# Patient Record
Sex: Female | Born: 1995
Health system: Southern US, Community
[De-identification: ages and names within clinical notes are randomized; demographics above are authoritative.]

## PROBLEM LIST (undated history)

## (undated) DIAGNOSIS — Z23 Encounter for immunization: Secondary | ICD-10-CM

## (undated) HISTORY — PX: TONSILLECTOMY: SHX5217

## (undated) HISTORY — DX: Encounter for immunization: Z23

---
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
## 2017-05-14 DIAGNOSIS — Z3202 Encounter for pregnancy test, result negative: Secondary | ICD-10-CM | POA: Diagnosis not present

## 2017-05-14 DIAGNOSIS — Z3200 Encounter for pregnancy test, result unknown: Secondary | ICD-10-CM | POA: Diagnosis not present

## 2017-08-27 DIAGNOSIS — Z1389 Encounter for screening for other disorder: Secondary | ICD-10-CM | POA: Diagnosis not present

## 2017-08-27 DIAGNOSIS — Z30011 Encounter for initial prescription of contraceptive pills: Secondary | ICD-10-CM | POA: Diagnosis not present

## 2017-08-27 DIAGNOSIS — Z0000 Encounter for general adult medical examination without abnormal findings: Secondary | ICD-10-CM | POA: Diagnosis not present

## 2017-08-27 DIAGNOSIS — Z3009 Encounter for other general counseling and advice on contraception: Secondary | ICD-10-CM | POA: Diagnosis not present

## 2017-08-27 DIAGNOSIS — Z3202 Encounter for pregnancy test, result negative: Secondary | ICD-10-CM | POA: Diagnosis not present

## 2017-12-14 ENCOUNTER — Encounter: Payer: Self-pay | Admitting: Family Medicine

## 2017-12-14 ENCOUNTER — Ambulatory Visit (INDEPENDENT_AMBULATORY_CARE_PROVIDER_SITE_OTHER): Payer: Self-pay | Admitting: Family Medicine

## 2017-12-14 VITALS — BP 120/80 | HR 82 | Temp 98.8°F | Wt 131.0 lb

## 2017-12-14 DIAGNOSIS — L089 Local infection of the skin and subcutaneous tissue, unspecified: Secondary | ICD-10-CM

## 2017-12-14 MED ORDER — CEPHALEXIN 500 MG PO CAPS: 500.0000 mg | ORAL_CAPSULE | Freq: Three times a day (TID) | ORAL | 0 refills | Status: AC

## 2017-12-14 MED ORDER — TRIAMCINOLONE ACETONIDE 0.1 % EX CREA: 1.0000 "application " | TOPICAL_CREAM | Freq: Two times a day (BID) | CUTANEOUS | 0 refills | Status: DC

## 2017-12-14 NOTE — Patient Instructions (Signed)

## 2017-12-14 NOTE — Progress Notes (Signed)
Patient ID: Heather Mack, female    DOB: 04-Dec-1995, 22 y.o.   MRN: [id number]  PCP: No primary care provider on file.  Chief Complaint  Patient presents with  . choice-? insect bite    Subjective:  HPI Heather Mack is a 22 y.o. female presents for evaluation of insect bite on her left lower extremity.  Onset of what patient suspected as a spider bite occurred approximately one month ago. She reports initially the lesion began to improve, however of recent she has developed small papules with white heads which itch and nodular type lesions which are tender to touch. Lesions are localized and doesn't appear on any other part of her body. She has not attempted relief with any topical medications. Denies fever, chills, drainage from the sight, or paraesthesias.  Social History   Socioeconomic History  . Marital status: Single    Spouse name: Not on file  . Number of children: Not on file  . Years of education: Not on file  . Highest education level: Not on file  Occupational History  . Not on file  Social Needs  . Financial resource strain: Not on file  . Food insecurity:    Worry: Not on file    Inability: Not on file  . Transportation needs:    Medical: Not on file    Non-medical: Not on file  Tobacco Use  . Smoking status: Never Smoker  . Smokeless tobacco: Never Used  Substance and Sexual Activity  . Alcohol use: Not on file  . Drug use: Not on file  . Sexual activity: Not on file  Lifestyle  . Physical activity:    Days per week: Not on file    Minutes per session: Not on file  . Stress: Not on file  Relationships  . Social connections:    Talks on phone: Not on file    Gets together: Not on file    Attends religious service: Not on file    Active member of club or organization: Not on file    Attends meetings of clubs or organizations: Not on file    Relationship status: Not on file  . Intimate partner violence:    Fear of current or ex partner: Not on file    Emotionally abused: Not on file    Physically abused: Not on file    Forced sexual activity: Not on file  Other Topics Concern  . Not on file  Social History Narrative  . Not on file   No family history on file.   Review of Systems  Pertinent negatives listed in HPI   There are no active problems to display for this patient.  Not on File  Prior to Admission medications   Medication Sig Start Date End Date Taking? Authorizing Provider  Janann Colonel 0.1-20 MG-MCG tablet  12/14/17  Yes [provider]    Past Medical, Surgical Family and Social History reviewed and updated.    Objective:   Today's Vitals   12/14/17 0858  BP: 120/80  Pulse: 82  Temp: 98.8 F (37.1 C)  SpO2: 98%  Weight: 131 lb (59.4 kg)    Wt Readings from Last 3 Encounters:  12/14/17 131 lb (59.4 kg)   Physical Exam  Constitutional: She appears well-developed and well-nourished.  Cardiovascular: Normal rate.  Pulmonary/Chest: Effort normal.  Skin: Skin is warm and intact. Rash noted. Rash is maculopapular and nodular.       Assessment & Plan:  1. Skin infection, unknown  etiology. Will treat with Keflex 500 mg, TID for skin infection. For itching, will trial a short course of topical steriodal ointment  BID until itching resolves. If no improvement after completion of antibiotics, patient advised to follow-up here or with PCP.   If symptoms worsen or do not improve, return for follow-up, follow-up with PCP, or at the emergency department if severity of symptoms warrant a higher level of care.   Godfrey PickKimberly S. Tiburcio PeaHarris, MSN, FNP-C [facility]  [address]  [place], [place] [address] [phone]

## 2020-05-06 NOTE — Patient Instructions (Signed)
I value your feedback and entrusting us with your care. If you get a [facility] patient survey, I would appreciate you taking the time to let us know about your experience today. Thank you!  As of June 08, 2019, your lab results will be released to your MyChart immediately, before I even have a chance to see them. Please give me time to review them and contact you if there are any abnormalities. Thank you for your patience.  

## 2020-05-06 NOTE — Progress Notes (Signed)
Patient, No Pcp Per   Chief Complaint  Patient presents with  . Vaginal Discharge    sour odor, itchiness, irritation   . Dysmenorrhea    x 4-5 months    HPI:      [name] is a 24 y.o. No obstetric history on file. whose LMP was Patient's last menstrual period was 04/11/2020 (exact date)., presents today for NP eval of increased vag d/c since adolescence. Sometimes has ext ithcing, usually triggerd by scratching; no fishy odor. Uses scented body wash, no dryer sheets, rare thong use, not using wipes. She is sex active, using condoms. Neg STD testing 1-2 yrs ago. Never had pap.   Menses monthly, lasting 4-5 days, no BTB, mild dysmen usually but has had increased dysmen past 4 months, sometimes missing work. Uses NSAIDs with some relief. Also with loose stools with menses. No non-menstrual pelvic pain. Sister with ovar cyst, mom with cx cancer. Did OCPs, nexplanon, and nuvaring in past. Doesn't like how she feels on [facility], prefers not to be on it.   Gardasil completed.   Past Medical History:  Diagnosis Date  . Vaccine for human papilloma virus (HPV) types 6, 11, 16, and 18 administered     Past Surgical History:  Procedure Laterality Date  . TONSILLECTOMY      Family History  Problem Relation Age of Onset  . Cervical cancer Mother 27    Social History   Socioeconomic History  . Marital status: Single    Spouse name: Not on file  . Number of children: Not on file  . Years of education: Not on file  . Highest education level: Not on file  Occupational History  . Not on file  Tobacco Use  . Smoking status: Never Smoker  . Smokeless tobacco: Never Used  Vaping Use  . Vaping Use: Never used  Substance and Sexual Activity  . Alcohol use: Yes  . Drug use: Never  . Sexual activity: Yes    Birth control/protection: None, Condom  Other Topics Concern  . Not on file  Social History Narrative  . Not on file   Social Determinants of Health   Financial  Resource Strain:   . Difficulty of Paying Living Expenses: Not on file  Food Insecurity:   . Worried About Programme researcher, broadcasting/film/video in the Last Year: Not on file  . Ran Out of Food in the Last Year: Not on file  Transportation Needs:   . Lack of Transportation (Medical): Not on file  . Lack of Transportation (Non-Medical): Not on file  Physical Activity:   . Days of Exercise per Week: Not on file  . Minutes of Exercise per Session: Not on file  Stress:   . Feeling of Stress : Not on file  Social Connections:   . Frequency of Communication with Friends and Family: Not on file  . Frequency of Social Gatherings with Friends and Family: Not on file  . Attends Religious Services: Not on file  . Active Member of Clubs or Organizations: Not on file  . Attends Banker Meetings: Not on file  . Marital Status: Not on file  Intimate Partner Violence:   . Fear of Current or Ex-Partner: Not on file  . Emotionally Abused: Not on file  . Physically Abused: Not on file  . Sexually Abused: Not on file    Outpatient Medications Prior to Visit  Medication Sig Dispense Refill  . AVIANE 0.1-20 MG-MCG tablet     .  triamcinolone cream (KENALOG) 0.1 % Apply 1 application topically 2 (two) times daily. 30 g 0   No facility-administered medications prior to visit.      ROS:  Review of Systems  Constitutional: Negative for fever.  Gastrointestinal: Negative for blood in stool, constipation, diarrhea, nausea and vomiting.  Genitourinary: Positive for vaginal discharge. Negative for dyspareunia, dysuria, flank pain, frequency, hematuria, urgency, vaginal bleeding and vaginal pain.  Musculoskeletal: Negative for back pain.  Skin: Negative for rash.  BREAST: No symptoms   OBJECTIVE:   Vitals:  BP 124/60   Ht 5\' 4"  (1.626 m)   Wt 153 lb (69.4 kg)   LMP 04/11/2020 (Exact Date)   BMI 26.26 kg/m   Physical Exam Vitals reviewed.  Constitutional:      Appearance: She is  well-developed.  Pulmonary:     Effort: Pulmonary effort is normal.  Genitourinary:    General: Normal vulva.     Pubic Area: No rash.      Labia:        Right: No rash, tenderness or lesion.        Left: No rash, tenderness or lesion.      Vagina: Normal. No vaginal discharge, erythema or tenderness.     Cervix: Normal.     Uterus: Normal. Not enlarged and not tender.      Adnexa: Right adnexa normal and left adnexa normal.       Right: No mass or tenderness.         Left: No mass or tenderness.    Musculoskeletal:        General: Normal range of motion.     Cervical back: Normal range of motion.  Skin:    General: Skin is warm and dry.  Neurological:     General: No focal deficit present.     Mental Status: She is alert and oriented to person, place, and time.  Psychiatric:        Mood and Affect: Mood normal.        Behavior: Behavior normal.        Thought Content: Thought content normal.        Judgment: Judgment normal.     Results: Results for orders placed or performed in visit on 05/07/20 (from the past 24 hour(s))  POCT Wet Prep with KOH     Status: Normal   Collection Time: 05/07/20  2:40 PM  Result Value Ref Range   Trichomonas, UA Negative    Clue Cells Wet Prep HPF POC neg    Epithelial Wet Prep HPF POC     Yeast Wet Prep HPF POC neg    Bacteria Wet Prep HPF POC     RBC Wet Prep HPF POC     WBC Wet Prep HPF POC     KOH Prep POC Negative Negative     Assessment/Plan: Vaginal discharge - Plan: NuSwab Vaginitis (VG), POCT Wet Prep with KOH; neg wet prep/exam. Check pap, STDs and culture. If neg, normal physiologic d/c. Discussed normal colors/consistencies. Use dove sens skin soap for itch; cold compresses/OTC hydrocortisone crm. F/u prn.   Dysmenorrhea--neg exam, no sx if not having period. Call with next menses for GYN u/s. Will f/u with results.   Cervical cancer screening - Plan: Cytology - PAP  Screening for STD (sexually transmitted disease) -  Plan: Cytology - PAP     Return if symptoms worsen or fail to improve.  Heather Mack B. Heather Ingraham, PA-C 05/07/2020 2:42 PM

## 2020-05-07 ENCOUNTER — Ambulatory Visit (INDEPENDENT_AMBULATORY_CARE_PROVIDER_SITE_OTHER): Payer: 59 | Admitting: Obstetrics and Gynecology

## 2020-05-07 ENCOUNTER — Other Ambulatory Visit: Payer: Self-pay

## 2020-05-07 ENCOUNTER — Encounter: Payer: Self-pay | Admitting: Obstetrics and Gynecology

## 2020-05-07 ENCOUNTER — Other Ambulatory Visit (HOSPITAL_COMMUNITY)
Admission: RE | Admit: 2020-05-07 | Discharge: 2020-05-07 | Disposition: A | Discharge status: Home / Self Care | Payer: Self-pay | Source: Ambulatory Visit | Attending: Obstetrics and Gynecology | Admitting: Obstetrics and Gynecology

## 2020-05-07 VITALS — BP 124/60 | Ht 64.0 in | Wt 153.0 lb

## 2020-05-07 DIAGNOSIS — Z113 Encounter for screening for infections with a predominantly sexual mode of transmission: Secondary | ICD-10-CM | POA: Diagnosis not present

## 2020-05-07 DIAGNOSIS — N946 Dysmenorrhea, unspecified: Secondary | ICD-10-CM

## 2020-05-07 DIAGNOSIS — N898 Other specified noninflammatory disorders of vagina: Secondary | ICD-10-CM | POA: Diagnosis not present

## 2020-05-07 DIAGNOSIS — Z124 Encounter for screening for malignant neoplasm of cervix: Secondary | ICD-10-CM | POA: Insufficient documentation

## 2020-05-07 LAB — POCT WET PREP WITH KOH
Clue Cells Wet Prep HPF POC: NEGATIVE
KOH Prep POC: NEGATIVE
Trichomonas, UA: NEGATIVE
Yeast Wet Prep HPF POC: NEGATIVE

## 2020-05-09 ENCOUNTER — Encounter: Payer: Self-pay | Admitting: Obstetrics and Gynecology

## 2020-05-09 DIAGNOSIS — N946 Dysmenorrhea, unspecified: Secondary | ICD-10-CM

## 2020-05-10 LAB — NUSWAB VAGINITIS (VG)
Candida albicans, NAA: POSITIVE — AB
Candida glabrata, NAA: NEGATIVE
Trich vag by NAA: NEGATIVE

## 2020-05-10 LAB — CYTOLOGY - PAP
Chlamydia: NEGATIVE
Comment: NEGATIVE
Comment: NORMAL
Diagnosis: NEGATIVE
Neisseria Gonorrhea: NEGATIVE

## 2020-05-11 MED ORDER — FLUCONAZOLE 150 MG PO TABS: 150.0000 mg | ORAL_TABLET | Freq: Once | ORAL | 0 refills | Status: AC

## 2020-05-11 NOTE — Addendum Note (Signed)
Addended by: Althea Grimmer B on: 05/11/2020 11:39 AM   Modules accepted: Orders

## 2020-05-14 ENCOUNTER — Ambulatory Visit (INDEPENDENT_AMBULATORY_CARE_PROVIDER_SITE_OTHER): Payer: 59

## 2020-05-14 ENCOUNTER — Other Ambulatory Visit: Payer: Self-pay

## 2020-05-14 DIAGNOSIS — N946 Dysmenorrhea, unspecified: Secondary | ICD-10-CM | POA: Diagnosis not present

## 2020-05-16 MED ORDER — NAPROXEN SODIUM 550 MG PO TABS: 550.0000 mg | ORAL_TABLET | Freq: Two times a day (BID) | ORAL | 3 refills | Status: AC

## 2020-05-16 NOTE — Telephone Encounter (Signed)
Pt aware of neg GYN u/s and labs for dysmen. Prefers not to be on hormones. Will do Rx anaprox BID. Start Aleve BID 2-3 days before menses. F/u prn.

## 2020-05-16 NOTE — Addendum Note (Signed)
Addended by: Althea Grimmer B on: 05/16/2020 03:33 PM   Modules accepted: Orders

## 2020-11-19 ENCOUNTER — Other Ambulatory Visit: Payer: Self-pay

## 2020-11-19 ENCOUNTER — Encounter: Payer: Self-pay | Admitting: Obstetrics and Gynecology

## 2020-11-19 ENCOUNTER — Ambulatory Visit (INDEPENDENT_AMBULATORY_CARE_PROVIDER_SITE_OTHER): Payer: Self-pay | Admitting: Obstetrics and Gynecology

## 2020-11-19 VITALS — BP 102/64 | Ht 63.0 in | Wt 157.0 lb

## 2020-11-19 DIAGNOSIS — Z30011 Encounter for initial prescription of contraceptive pills: Secondary | ICD-10-CM

## 2020-11-19 MED ORDER — DROSPIRENONE-ETHINYL ESTRADIOL 3-0.02 MG PO TABS: 1.0000 | ORAL_TABLET | Freq: Every day | ORAL | 1 refills | Status: AC

## 2020-11-19 NOTE — Progress Notes (Signed)
Patient, No Pcp Per (Inactive)   Chief Complaint  Patient presents with  . Contraception    Not on [facility] currently, not sure of BC method    HPI:      [name] is a 25 y.o. G0P0000 whose LMP was Patient's last menstrual period was 11/02/2020 (exact date)., presents today for [facility] consult. Menses are monthly, lasting 4-5 days, mod flow, no BTB. Has dysmen, takes Rx anaprox with sx relief. She is sex active, using condoms sometimes. Had wt gain/anger/depression with OCPs in past. I could only find that she was on aviane at one time (pharmacy didn't have record of other OCPs in past). Didn't like the nuvaring and had migraines with nexplanon (no hx of migraines off nexplanon). No hx of HTN, DVTs.   Neg pap 11/21  Past Medical History:  Diagnosis Date  . Vaccine for human papilloma virus (HPV) types 6, 11, 16, and 18 administered     Past Surgical History:  Procedure Laterality Date  . TONSILLECTOMY      Family History  Problem Relation Age of Onset  . Cervical cancer Mother 67    Social History   Socioeconomic History  . Marital status: Single    Spouse name: Not on file  . Number of children: Not on file  . Years of education: Not on file  . Highest education level: Not on file  Occupational History  . Not on file  Tobacco Use  . Smoking status: Never Smoker  . Smokeless tobacco: Never Used  Vaping Use  . Vaping Use: Never used  Substance and Sexual Activity  . Alcohol use: Yes  . Drug use: Never  . Sexual activity: Yes    Birth control/protection: None, Condom  Other Topics Concern  . Not on file  Social History Narrative  . Not on file   Social Determinants of Health   Financial Resource Strain: Not on file  Food Insecurity: Not on file  Transportation Needs: Not on file  Physical Activity: Not on file  Stress: Not on file  Social Connections: Not on file  Intimate Partner Violence: Not on file    Outpatient Medications Prior to Visit   Medication Sig Dispense Refill  . naproxen sodium (ANAPROX DS) 550 MG tablet Take 1 tablet (550 mg total) by mouth 2 (two) times daily with a meal. 30 tablet 3   No facility-administered medications prior to visit.      ROS:  Review of Systems  Constitutional: Negative for fever.  Gastrointestinal: Negative for blood in stool, constipation, diarrhea, nausea and vomiting.  Genitourinary: Negative for dyspareunia, dysuria, flank pain, frequency, hematuria, urgency, vaginal bleeding, vaginal discharge and vaginal pain.  Musculoskeletal: Negative for back pain.  Skin: Negative for rash.    OBJECTIVE:   Vitals:  BP 102/64   Ht 5\' 3"  (1.6 m)   Wt 157 lb (71.2 kg)   LMP 11/02/2020 (Exact Date)   BMI 27.81 kg/m   Physical Exam Constitutional:      Appearance: Normal appearance.  Pulmonary:     Effort: Pulmonary effort is normal.  Musculoskeletal:        General: Normal range of motion.  Neurological:     Mental Status: She is alert and oriented to person, place, and time.  Psychiatric:        Judgment: Judgment normal.     Assessment/Plan: Encounter for initial prescription of contraceptive pills - Plan: drospirenone-ethinyl estradiol (YAZ) 3-0.02 MG tablet; BC options discussed.  Pt would like to try different OCP. Rx yaz (given hx of mood issues with OCPs). Start with next menses, condoms for 1 mo. F/u at 11/22 annual/sooner prn.   Meds ordered this encounter  Medications  . drospirenone-ethinyl estradiol (YAZ) 3-0.02 MG tablet    Sig: Take 1 tablet by mouth daily.    Dispense:  84 tablet    Refill:  1    Order Specific Question:   Supervising Provider    Answer:   Nadara Mustard [[id number]]      Return if symptoms worsen or fail to improve.  [place] B. Tiburcio Linder, PA-C 11/19/2020 3:47 PM

## 2020-11-19 NOTE — Patient Instructions (Signed)
I value your feedback and you entrusting us with your care. If you get a  patient survey, I would appreciate you taking the time to let us know about your experience today. Thank you! ? ? ?

## 2021-04-13 NOTE — Progress Notes (Deleted)
    Patient, No Pcp Per (Inactive)   No chief complaint on file.   HPI:      [name] is a 25 y.o. G0P0000 whose LMP was No LMP recorded., presents today for *** Started on yaz 5/22 Had wt gain/anger/depression with OCPs in past. I could only find that she was on aviane at one time (pharmacy didn't have record of other OCPs in past). Didn't like the nuvaring and had migraines with nexplanon (no hx of migraines off nexplanon). No hx of HTN, DVTs.    Past Medical History:  Diagnosis Date   Vaccine for human papilloma virus (HPV) types 6, 11, 16, and 18 administered     Past Surgical History:  Procedure Laterality Date   TONSILLECTOMY      Family History  Problem Relation Age of Onset   Cervical cancer Mother 51    Social History   Socioeconomic History   Marital status: Single    Spouse name: Not on file   Number of children: Not on file   Years of education: Not on file   Highest education level: Not on file  Occupational History   Not on file  Tobacco Use   Smoking status: Never   Smokeless tobacco: Never  Vaping Use   Vaping Use: Never used  Substance and Sexual Activity   Alcohol use: Yes   Drug use: Never   Sexual activity: Yes    Birth control/protection: None, Condom  Other Topics Concern   Not on file  Social History Narrative   Not on file   Social Determinants of Health   Financial Resource Strain: Not on file  Food Insecurity: Not on file  Transportation Needs: Not on file  Physical Activity: Not on file  Stress: Not on file  Social Connections: Not on file  Intimate Partner Violence: Not on file    Outpatient Medications Prior to Visit  Medication Sig Dispense Refill   drospirenone-ethinyl estradiol (YAZ) 3-0.02 MG tablet Take 1 tablet by mouth daily. 84 tablet 1   naproxen sodium (ANAPROX DS) 550 MG tablet Take 1 tablet (550 mg total) by mouth 2 (two) times daily with a meal. 30 tablet 3   No facility-administered medications  prior to visit.      ROS:  Review of Systems BREAST: No symptoms   OBJECTIVE:   Vitals:  There were no vitals taken for this visit.  Physical Exam  Results: No results found for this or any previous visit (from the past 24 hour(s)).   Assessment/Plan: No diagnosis found.    No orders of the defined types were placed in this encounter.     No follow-ups on file.  Irie Fiorello B. Kenzly Rogoff, PA-C 04/13/2021 9:51 AM

## 2021-04-14 ENCOUNTER — Ambulatory Visit: Payer: Self-pay | Admitting: Obstetrics and Gynecology

## 2021-10-17 ENCOUNTER — Ambulatory Visit (LOCAL_COMMUNITY_HEALTH_CENTER): Payer: Self-pay

## 2021-10-17 DIAGNOSIS — Z719 Counseling, unspecified: Secondary | ICD-10-CM

## 2021-10-17 DIAGNOSIS — Z23 Encounter for immunization: Secondary | ICD-10-CM

## 2021-10-17 NOTE — Progress Notes (Signed)
Patient here for vaccinations for college. Needs vaccinations prior to clinicals next Tuesday.  Tdap administered in right deltoid.  Patient also wanted MMR and Varicella - [facility] incomplete for MMR.  Varicella vaccination complete.  Patient referred to primary MD or school district to obtain vaccination records prior to obtaining MMR.  Patient tolerated vaccine well. [facility] updated and 2 copies of [facility] provided to patient.  ?

## 2023-10-29 ENCOUNTER — Ambulatory Visit
Admission: EM | Admit: 2023-10-29 | Discharge: 2023-10-29 | Disposition: A | Discharge status: Home / Self Care | Attending: Family Medicine | Admitting: Family Medicine

## 2023-10-29 ENCOUNTER — Encounter: Payer: Self-pay | Admitting: Emergency Medicine

## 2023-10-29 DIAGNOSIS — N3001 Acute cystitis with hematuria: Secondary | ICD-10-CM | POA: Diagnosis present

## 2023-10-29 LAB — URINALYSIS, W/ REFLEX TO CULTURE (INFECTION SUSPECTED)
Bilirubin Urine: NEGATIVE
Glucose, UA: NEGATIVE mg/dL
Nitrite: NEGATIVE
Protein, ur: 30 mg/dL — AB
Specific Gravity, Urine: 1.025 (ref 1.005–1.030)
pH: 6 (ref 5.0–8.0)

## 2023-10-29 MED ORDER — NITROFURANTOIN MONOHYD MACRO 100 MG PO CAPS: 100.0000 mg | ORAL_CAPSULE | Freq: Two times a day (BID) | ORAL | 0 refills | Status: AC

## 2023-10-29 MED ORDER — FLUCONAZOLE 150 MG PO TABS: 150.0000 mg | ORAL_TABLET | Freq: Once | ORAL | 0 refills | Status: AC

## 2023-10-29 NOTE — ED Triage Notes (Signed)
 Pt c/o dysuria, urinary frequency. Started about 5 hours ago. She states her vaginal area feels swollen. Denies vaginal discharge.

## 2023-10-29 NOTE — Discharge Instructions (Signed)
 Stop by the pharmacy to pick up your antibiotics.  Take as prescribed.  I sent your urine for culture, someone may call to change or stop antibiotics based off this.   Your vaginal swab results will be available in the next 24-72 hours. If positive, someone will contact you.  You should see your results in your MyChart account.

## 2023-10-29 NOTE — ED Provider Notes (Signed)
 [facility]    CSN: [id number] Arrival date & time: 10/29/23  0865      History   Chief Complaint Chief Complaint  Patient presents with   Dysuria     HPI HPI Heather Mack is a 28 y.o. female.    Heather Mack presents for burning with urination with urinary odor that started this morning.  Tried nothing prior to arrival. About a month a ago she had a kidney infection.  Has not had any antibiotics in last 30 days.   Denies known STI exposure.  Patient's last menstrual period was 10/08/2023 (approximate).    - Abnormal vaginal discharge: no - vaginal odor: no - vaginal bleeding: no - Dysuria: yes - Hematuria: no - Urinary urgency: yes  - Urinary frequency: yes   - Fever and chills: no - Abdominal pain: no  - Pelvic pain: cramping - Rash/Skin lesions/mouth ulcers: no - Nausea: no  - Vomiting: no  - Back Pain: no        Past Medical History:  Diagnosis Date   Vaccine for human papilloma virus (HPV) types 6, 11, 16, and 18 administered     There are no active problems to display for this patient.   Past Surgical History:  Procedure Laterality Date   TONSILLECTOMY      OB History     Gravida  0   Para  0   Term  0   Preterm  0   AB  0   Living  0      SAB  0   IAB  0   Ectopic  0   Multiple  0   Live Births  0            Home Medications    Prior to Admission medications   Medication Sig Start Date End Date Taking? Authorizing Provider  fluconazole  (DIFLUCAN ) 150 MG tablet Take 1 tablet (150 mg total) by mouth once for 1 dose. 10/29/23 10/29/23 Yes Jada Fass, DO  nitrofurantoin, macrocrystal-monohydrate, (MACROBID) 100 MG capsule Take 1 capsule (100 mg total) by mouth 2 (two) times daily. 10/29/23  Yes Malaysha Arlen, DO  drospirenone -ethinyl estradiol  (YAZ) 3-0.02 MG tablet Take 1 tablet by mouth daily. 11/19/20   Copland, Alicia B, PA-C  naproxen  sodium (ANAPROX  DS) 550 MG tablet Take 1 tablet (550 mg total) by  mouth 2 (two) times daily with a meal. 05/16/20   Copland, Amada Jun, PA-C    Family History Family History  Problem Relation Age of Onset   Cervical cancer Mother 14    Social History Social History   Tobacco Use   Smoking status: Never   Smokeless tobacco: Never  Vaping Use   Vaping status: Never Used  Substance Use Topics   Alcohol use: Yes   Drug use: Never     Allergies   Patient has no known allergies.   Review of Systems Review of Systems: :negative unless otherwise stated in HPI.      Physical Exam Triage Vital Signs ED Triage Vitals  Encounter Vitals Group     BP 10/29/23 0836 120/77     Systolic BP Percentile --      Diastolic BP Percentile --      Pulse Rate 10/29/23 0836 68     Resp 10/29/23 0836 16     Temp 10/29/23 0836 98.6 F (37 C)     Temp Source 10/29/23 0836 Oral     SpO2 10/29/23 0836 98 %  Weight 10/29/23 0835 156 lb 15.5 oz (71.2 kg)     Height 10/29/23 0835 5\' 3"  (1.6 m)     Head Circumference --      Peak Flow --      Pain Score 10/29/23 0834 6     Pain Loc --      Pain Education --      Exclude from Growth Chart --    No data found.  Updated Vital Signs BP 120/77 (BP Location: Right Arm)   Pulse 68   Temp 98.6 F (37 C) (Oral)   Resp 16   Ht 5\' 3"  (1.6 m)   Wt 71.2 kg   LMP 10/08/2023 (Approximate)   SpO2 98%   BMI 27.81 kg/m   Visual Acuity Right Eye Distance:   Left Eye Distance:   Bilateral Distance:    Right Eye Near:   Left Eye Near:    Bilateral Near:     Physical Exam GEN: well appearing female in no acute distress  CVS: well perfused  RESP: speaking in full sentences without pause  ABD: soft, suprapubic tenderness, non-distended, no palpable masses, no CVA tenderness    GU: deferred, patient performed self swab     UC Treatments / Results  Labs (all labs ordered are listed, but only abnormal results are displayed) Labs Reviewed  URINALYSIS, W/ REFLEX TO CULTURE (INFECTION SUSPECTED) -  Abnormal; Notable for the following components:      Result Value   Hgb urine dipstick TRACE (*)    Ketones, ur TRACE (*)    Protein, ur 30 (*)    Leukocytes,Ua TRACE (*)    Bacteria, UA FEW (*)    All other components within normal limits  URINE CULTURE  CERVICOVAGINAL ANCILLARY ONLY    EKG   Radiology No results found.  Procedures Procedures (including critical care time)  Medications Ordered in UC Medications - No data to display  Initial Impression / Assessment and Plan / UC Course  I have reviewed the triage vital signs and the nursing notes.  Pertinent labs & imaging results that were available during my care of the patient were reviewed by me and considered in my medical decision making (see chart for details).      Patient is a 28 y.o.Heather Mack female  who presents for dysuria with urinary frequency and urgency  Overall patient is well-appearing and afebrile.  Vital signs stable.    Reviewed [facility] ED provider notes and labs from her ED visit on 09/22/23. Pt diagnosed with pyelonephritis via CT ABD/Pelvis and had fever with elevated WBC.  She was treated with CTX x1 in ED and discharged with 7 day treatment of Cipro. Pt reports completing her antibiotic course.   UA consistent with acute cystitis.   Hematuria supported on microscopy.  Treat with Macrobid 2 times daily for 5 days. Urine culture obtained.  Follow-up sensitivities and change antibiotics, if needed.  Vaginal swab for yeast vaginitis and bacterial vaginitis, trichomonas, gonorrhea and chlamydia obtained.   - Treatment:Macrobid twice daily for 5 days - Diflucan   for prevention of yeast infection    Return precautions including abdominal pain, fever, chills, nausea, or vomiting given. Discussed MDM, treatment plan and plan for follow-up with patient who agrees with plan.      Final Clinical Impressions(s) / UC Diagnoses   Final diagnoses:  Acute cystitis with hematuria     Discharge Instructions      Stop  by the pharmacy to pick up your  antibiotics.  Take as prescribed.  I sent your urine for culture, someone may call to change or stop antibiotics based off this.   Your vaginal swab results will be available in the next 24-72 hours. If positive, someone will contact you.  You should see your results in your MyChart account.       ED Prescriptions     Medication Sig Dispense Auth. Provider   nitrofurantoin, macrocrystal-monohydrate, (MACROBID) 100 MG capsule Take 1 capsule (100 mg total) by mouth 2 (two) times daily. 10 capsule Kiani Wurtzel, DO   fluconazole  (DIFLUCAN ) 150 MG tablet Take 1 tablet (150 mg total) by mouth once for 1 dose. 1 tablet Fidel Huddle, DO      PDMP not reviewed this encounter.   Keshayla Schrum, DO 10/29/23 [phone]

## 2023-10-30 ENCOUNTER — Ambulatory Visit: Payer: Self-pay

## 2023-10-30 LAB — URINE CULTURE: Culture: <10000 — AB

## 2023-11-01 LAB — CERVICOVAGINAL ANCILLARY ONLY
Bacterial Vaginitis (gardnerella): NEGATIVE
Candida Glabrata: NEGATIVE
Candida Vaginitis: POSITIVE — AB
Chlamydia: NEGATIVE
Comment: NEGATIVE
Comment: NEGATIVE
Comment: NEGATIVE
Comment: NEGATIVE
Comment: NEGATIVE
Comment: NORMAL
Neisseria Gonorrhea: NEGATIVE
Trichomonas: NEGATIVE
